# Patient Record
Sex: Male | Born: 1940 | Race: White | Hispanic: No
Health system: Southern US, Community
[De-identification: ages and names within clinical notes are randomized; demographics above are authoritative.]

---

## 2004-04-22 ENCOUNTER — Emergency Department (HOSPITAL_COMMUNITY): Admission: EM | Admit: 2004-04-22 | Discharge: 2004-04-22 | Payer: Self-pay | Admitting: *Deleted

## 2004-04-23 ENCOUNTER — Emergency Department (HOSPITAL_COMMUNITY): Admission: EM | Admit: 2004-04-23 | Discharge: 2004-04-23 | Payer: Self-pay | Admitting: Emergency Medicine

## 2004-04-24 ENCOUNTER — Ambulatory Visit (HOSPITAL_COMMUNITY): Payer: Self-pay | Admitting: General Surgery

## 2004-04-24 ENCOUNTER — Encounter (HOSPITAL_COMMUNITY): Admission: RE | Admit: 2004-04-24 | Discharge: 2004-05-24 | Payer: Self-pay | Admitting: Emergency Medicine

## 2004-04-28 ENCOUNTER — Inpatient Hospital Stay (HOSPITAL_COMMUNITY): Admission: AD | Admit: 2004-04-28 | Discharge: 2004-05-05 | Payer: Self-pay | Admitting: General Surgery

## 2004-08-20 ENCOUNTER — Encounter (HOSPITAL_COMMUNITY): Admission: RE | Admit: 2004-08-20 | Discharge: 2004-09-19 | Payer: Self-pay | Admitting: *Deleted

## 2004-09-24 ENCOUNTER — Encounter (HOSPITAL_COMMUNITY): Admission: RE | Admit: 2004-09-24 | Discharge: 2004-10-24 | Payer: Self-pay | Admitting: Internal Medicine

## 2005-01-22 ENCOUNTER — Encounter (HOSPITAL_COMMUNITY): Admission: RE | Admit: 2005-01-22 | Discharge: 2005-02-21 | Payer: Self-pay | Admitting: *Deleted

## 2005-03-04 ENCOUNTER — Encounter (HOSPITAL_COMMUNITY): Admission: RE | Admit: 2005-03-04 | Discharge: 2005-04-03 | Payer: Self-pay | Admitting: *Deleted
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
## 2007-02-02 ENCOUNTER — Encounter (HOSPITAL_COMMUNITY): Admission: RE | Admit: 2007-02-02 | Discharge: 2007-03-04 | Payer: Self-pay | Admitting: Internal Medicine

## 2007-03-08 ENCOUNTER — Encounter (HOSPITAL_COMMUNITY): Admission: RE | Admit: 2007-03-08 | Discharge: 2007-04-07 | Payer: Self-pay | Admitting: Internal Medicine

## 2007-04-16 ENCOUNTER — Encounter (HOSPITAL_COMMUNITY): Admission: RE | Admit: 2007-04-16 | Discharge: 2007-05-16 | Payer: Self-pay | Admitting: Internal Medicine
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
## 2013-06-30 ENCOUNTER — Ambulatory Visit (HOSPITAL_COMMUNITY)
Admission: RE | Admit: 2013-06-30 | Discharge: 2013-06-30 | Disposition: A | Discharge status: Home / Self Care | Payer: Non-veteran care | Source: Ambulatory Visit | Attending: Internal Medicine | Admitting: Internal Medicine

## 2013-06-30 DIAGNOSIS — M6281 Muscle weakness (generalized): Secondary | ICD-10-CM | POA: Insufficient documentation

## 2013-06-30 DIAGNOSIS — R269 Unspecified abnormalities of gait and mobility: Secondary | ICD-10-CM | POA: Insufficient documentation

## 2013-06-30 DIAGNOSIS — R262 Difficulty in walking, not elsewhere classified: Secondary | ICD-10-CM | POA: Insufficient documentation

## 2013-06-30 DIAGNOSIS — IMO0001 Reserved for inherently not codable concepts without codable children: Secondary | ICD-10-CM | POA: Insufficient documentation

## 2013-06-30 NOTE — Evaluation (Signed)
Physical Therapy Evaluation  Patient Details  Name: [name] D Mocarski MRN: [id number] Date of Birth: 01/05/1941  Today's Date: 06/30/2013 Time: 0855-0930 PT Time Calculation (min): 35 min Charges: Eval 1              Visit#: 1 of 12  Re-eval: 07/30/13 Assessment Diagnosis: Difficulty standing secondary to weakness and poor balance s/p 5th metatarsal fusion following fracture Prior Therapy: no  Authorization: [facility]    Authorization Time Period:    Authorization Visit#: 1 of 12   Past Medical History: No past medical history on file. Past Surgical History: No past surgical history on file.  Subjective Symptoms/Limitations Symptoms: No pain, N/T over the end of the 5th meta tarsal.  Pertinent History: PAtient is s/p fusion of 5th metatarsal following 5th meta tarsal fracture secondary to fall whle ambulating at night >6 months ago. Patient and daughter are poor historians give patient histpry at random. Prior to fracture patient ambulated with SPC. 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 H/o back pain that radiates from neck to posterior hip since 1968 resulting in difficulty sitting. 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  Limitations: Standing How long can you stand comfortably?: Patient is unable to stand >5830minutes secondary to back and hip pain.  Pain Assessment Currently in Pain?: Yes Pain Score: 4  Pain Location: Back Pain Orientation: Mid;Lower Pain Type: Chronic pain Pain Onset: Other (comment) Pain Frequency: Intermittent  Precautions/Restrictions     Balance Screening    Prior Functioning  Prior Function Level of Independence: Independent with basic ADLs  Cognition/Observation Cognition Overall Cognitive Status: Within Functional Limits for tasks assessed Observation/Other Assessments Observations: Forward flexed  posture.   Sensation/Coordination/Flexibility/Functional Tests Sensation Light Touch: Appears Intact Coordination Gross Motor Movements are Fluid and Coordinated: Yes Flexibility Thomas: Positive Functional Tests Functional Tests: single leg stand <4seconds bilateral Functional Tests: Standing reach 8in bilateral   Assessment RLE AROM (degrees) Right Hip Extension: 5 Right Hip Flexion: 90 Right Knee Extension: 0 Right Knee Flexion: 120 Right Ankle Dorsiflexion: 6 Right Ankle Plantar Flexion: 55 Right Ankle Inversion: 15 Right Ankle Eversion: 10 RLE Strength Right Hip ABduction: 4/5 Right Knee Flexion:  (4+/5) Right Knee Extension: 5/5  Exercise/Treatments Mobility/Balance  Ambulation/Gait Ambulation/Gait: Yes Ambulation/Gait Assistance: 5: Supervision Ambulation Distance (Feet): 300 Feet (in 3 minutes) Assistive device: 4-wheeled walker Gait Pattern: Within Functional Limits   Physical Therapy Assessment and Plan PT Assessment and Plan Clinical Impression Statement: Patient displays difficulty walking, need for 4WW with ambulation due to decreased balance secondary to weakness following casting and sedentary life style following R foot 5th metatarsal fracture and subsequent fusion. Patient display decreased foot and ankle mobility , along with decreased strength resulting in difficulty performing single leg tasks and difficulty balancing. Patient will benefit from skilled PT to improve foot and ankle mobility and return patient to prior lovel of function ambulating with SPC with decreased risk of falls and future subsequent injury.      Pt will benefit from skilled therapeutic intervention in order to improve on the following deficits: Abnormal gait;Decreased activity tolerance;Decreased balance;Decreased coordination;Decreased range of motion;Decreased mobility;Impaired flexibility;Decreased strength PT Frequency: Min 2X/week PT Duration: 6 weeks PT  Treatment/Interventions: Gait training;Stair training;Functional mobility training;Therapeutic activities;Therapeutic exercise;Balance training;Patient/family education;Manual techniques PT Plan: Patient was instruted in a walking program this session to increase exercise tolerance. therapy next session to  focus on increasing ankle dorsiflexion, plantar flexion and eversion ROM, Along with increasing foot (DF,PF) and hip (Abd, Ext) strength.     Goals Home Exercise Program Pt/caregiver will Perform Home Exercise Program: For increased strengthening;For increased ROM;For improved balance;Independently PT Goal: Perform Home Exercise Program - Progress: Progressing toward goal PT Short Term Goals Time to Complete Short Term Goals: 3 weeks PT Short Term Goal 1: PAtient's hip abduction strrength will improve to 4/5 from 4-/5 to indicate improved LE strength and stabiliy so patient will be able to tolerate increased time standing.  PT Short Term Goal 2: Patient will be able to reah 10inches forward indicating patient not at high risk for falls PT Short Term Goal 3: Patient will be able to ambulate 356ft with SPC with no loss of balance.  PT Long Term Goals Time to Complete Long Term Goals: Other (comment) (6 weeks) PT Long Term Goal 1: patient will have a BERG balance score >52/56 PT Long Term Goal 2: Patient will be able to single leg stand > 5seconds without loss of balance.  Long Term Goal 3: Patient will be able to ambulate up a flight of stairs with single point cane independently.   Problem List Patient Active Problem List   Diagnosis Date Noted  . Difficulty in walking(719.7) 06/30/2013    PT - End of Session Activity Tolerance: Patient tolerated treatment well General Behavior During Therapy: [facility] for tasks assessed/performed PT Plan of Care PT Home Exercise Plan: Walking program Consulted and Agree with Plan of Care: Patient;Family member/caregiver Family Member Consulted:  daughter  GP Functional Assessment Tool Used: FOTO  Functional Limitation: Mobility: Walking and moving around Mobility: Walking and Moving Around Current Status [phone]): At least 40 percent but less than 60 percent impaired, limited or restricted Mobility: Walking and Moving Around Goal Status [phone]): At least 20 percent but less than 40 percent impaired, limited or restricted  Martita Brumm R PT DPT 06/30/2013, 12:54 PM  Physician Documentation Your signature is required to indicate approval of the treatment plan as stated above.  Please sign and either send electronically or make a copy of this report for your files and return this physician signed original.   Please mark one 1.__approve of plan  2. ___approve of plan with the following conditions.   ______________________________                                                          _____________________ Physician Signature                                                                                                             Date

## 2013-07-05 ENCOUNTER — Ambulatory Visit (HOSPITAL_COMMUNITY): Payer: Non-veteran care | Admitting: Physical Therapy

## 2013-07-07 ENCOUNTER — Ambulatory Visit (HOSPITAL_COMMUNITY)
Admission: RE | Admit: 2013-07-07 | Discharge: 2013-07-07 | Disposition: A | Discharge status: Home / Self Care | Payer: Non-veteran care | Source: Ambulatory Visit | Attending: Internal Medicine | Admitting: Internal Medicine

## 2013-07-07 DIAGNOSIS — IMO0001 Reserved for inherently not codable concepts without codable children: Secondary | ICD-10-CM | POA: Diagnosis present

## 2013-07-07 DIAGNOSIS — M6281 Muscle weakness (generalized): Secondary | ICD-10-CM | POA: Insufficient documentation

## 2013-07-07 DIAGNOSIS — R269 Unspecified abnormalities of gait and mobility: Secondary | ICD-10-CM | POA: Diagnosis not present

## 2013-07-07 DIAGNOSIS — R262 Difficulty in walking, not elsewhere classified: Secondary | ICD-10-CM | POA: Insufficient documentation

## 2013-07-07 NOTE — Progress Notes (Signed)
Physical Therapy Treatment Patient Details  Name: Karlton Lemonerry D Buley MRN: [id number] Date of Birth: 07/11/1940  Today's Date: 07/07/2013 Time: 0840-0932 PT Time Calculation (min): 52 min Charge: TE [id number]  Visit#: 2 of 12  Re-eval: 07/30/13    Authorization: [facility]  Authorization Time Period:    Authorization Visit#: 2 of 12   Subjective: Symptoms/Limitations Symptoms: Pain from occipital region down to toes Pain Assessment Currently in Pain?: Yes Pain Score: 8  Pain Location: Generalized  Objective:   Exercise/Treatments Ankle Stretches Soleus Stretch: 2 reps;30 seconds;Limitations Soleus Stretch Limitations: Bil LE Gastroc Stretch: 2 reps;30 seconds;Limitations Gastroc Stretch Limitations: Bil LE Ankle Exercises - Standing Rocker Board: 2 minutes;Limitations Rocker Board Limitations: R/L Heel Raises: 10 reps Toe Raise: 10 reps Other Standing Ankle Exercises: Gait training with RW with cueing to stance and posture Ankle Exercises - Seated Towel Crunch: 1 rep Towel Inversion/Eversion: 2 reps Marble Pickup: attempted Other Seated Ankle Exercises: Manual tibialis anterior stretches    Manual Therapy Manual Therapy: Joint mobilization Joint Mobilization: Grade II ankle joint mobs  Physical Therapy Assessment and Plan PT Assessment and Plan Clinical Impression Statement: Began POC for ankle ROM, gait training and strengthening.  Pt able to complete exercises following demonstration and verbal cueing for techniuqe with manual facilitation to reduce compensation with hip and knee for increased ROM with ankle.  Stretches added to improve flexibilty.  Gait training with cueing for posture, heel to toe pattern and to increase stride length for more normalized gait mechanics.   No reports of increased pain through session, pt was limited by fatigue. PT Plan: Continue current POC for gait training with LRAD, continue [place] program for LE strenghtening and balance, stretches to  improve ankle ROM.      Goals Home Exercise Program Pt/caregiver will Perform Home Exercise Program: For increased strengthening;For increased ROM;For improved balance;Independently PT Short Term Goals Time to Complete Short Term Goals: 3 weeks PT Short Term Goal 1: PAtient's hip abduction strrength will improve to 4/5 from 4-/5 to indicate improved LE strength and stabiliy so patient will be able to tolerate increased time standing.  PT Short Term Goal 2: Patient will be able to reah 10inches forward indicating patient not at high risk for falls PT Short Term Goal 3: Patient will be able to ambulate 35700ft with SPC with no loss of balance.  PT Short Term Goal 3 - Progress: Progressing toward goal PT Long Term Goals Time to Complete Long Term Goals: Other (comment) (6 weeks) PT Long Term Goal 1: patient will have a BERG balance score >52/56 PT Long Term Goal 2: Patient will be able to single leg stand > 5seconds without loss of balance.  Long Term Goal 3: Patient will be able to ambulate up a flight of stairs with single point cane independently.   Problem List Patient Active Problem List   Diagnosis Date Noted  . Difficulty in walking(719.7) 06/30/2013    PT - End of Session Activity Tolerance: Patient tolerated treatment well General Behavior During Therapy: [facility] for tasks assessed/performed  GP    Juel BurrowCockerham, Rhet Rorke Jo 07/07/2013, 12:13 PM

## 2013-07-12 ENCOUNTER — Ambulatory Visit (HOSPITAL_COMMUNITY)
Admission: RE | Admit: 2013-07-12 | Discharge: 2013-07-12 | Disposition: A | Discharge status: Home / Self Care | Payer: Non-veteran care | Source: Ambulatory Visit | Attending: *Deleted | Admitting: *Deleted

## 2013-07-12 DIAGNOSIS — IMO0001 Reserved for inherently not codable concepts without codable children: Secondary | ICD-10-CM | POA: Diagnosis not present

## 2013-07-12 NOTE — Progress Notes (Signed)
Physical Therapy Treatment Patient Details  Name: [name] MRN: [id number] Date of Birth: 04/26/1940  Today's Date: 07/12/2013 Time: 3086-57840848-0938 PT Time Calculation (min): 50 min Charge: TE [id number]  Visit#: 3 of 12  Re-eval:   Assessment Diagnosis: Difficulty standing secondary to weakness and poor balance s/p 5th metatarsal fusion following fracture Next MD Visit: Charlott HollerEdelman 08/03/2013 Prior Therapy: no  Authorization: [facility]  Authorization Time Period:    Authorization Visit#: 3 of 12   Subjective: Symptoms/Limitations Symptoms: Doing fabulous today, been exercising infront of sink daily.   Pain Assessment Currently in Pain?: No/denies  Objective:   Exercise/Treatments Stretches Gastroc Stretch: 3 reps;30 seconds;Limitations Gastroc Stretch Limitations: slant board Standing Heel Raises: 15 reps;Limitations Heel Raises Limitations: Toe raises 15x Knee Flexion: Both;10 reps Rocker Board: 2 minutes;Limitations Rocker Board Limitations: R/L and A/P Other Standing Knee Exercises: abduction Bil LE 10x Other Standing Knee Exercises: Sidestepping and retro gait 1RT  Ankle Exercises - Seated Towel Crunch: 1 rep Towel Inversion/Eversion: 2 reps    Physical Therapy Assessment and Plan PT Assessment and Plan Clinical Impression Statement: Progressed standing balance and strengthening exercises to improve gait mechanics.  Min assistance requireid with balance activities with cueing to improve spatial awareness with less assistance required.  Pt with improved coordination with towel exercises.  Pt c/o tight calf pain, stretching complete to improve flexibility and assist with pain reduction.   PT Plan: Next session begin gait training with SPC, continye [place] program for LE strengthening and balance, stretches to improve ankle ROM.  Begin seated BAPS board.    Goals Home Exercise Program Pt/caregiver will Perform Home Exercise Program: For increased strengthening;For increased  ROM;For improved balance;Independently PT Short Term Goals Time to Complete Short Term Goals: 3 weeks PT Short Term Goal 1: PAtient's hip abduction strrength will improve to 4/5 from 4-/5 to indicate improved LE strength and stabiliy so patient will be able to tolerate increased time standing.  PT Short Term Goal 1 - Progress: Progressing toward goal PT Short Term Goal 2: Patient will be able to reah 10inches forward indicating patient not at high risk for falls PT Short Term Goal 3: Patient will be able to ambulate 35200ft with SPC with no loss of balance.  PT Short Term Goal 3 - Progress: Progressing toward goal PT Long Term Goals Time to Complete Long Term Goals: Other (comment) (6 weeks) PT Long Term Goal 1: patient will have a BERG balance score >52/56 PT Long Term Goal 1 - Progress: Progressing toward goal PT Long Term Goal 2: Patient will be able to single leg stand > 5seconds without loss of balance.  Long Term Goal 3: Patient will be able to ambulate up a flight of stairs with single point cane independently.   Problem List Patient Active Problem List   Diagnosis Date Noted  . Difficulty in walking(719.7) 06/30/2013    PT - End of Session Equipment Utilized During Treatment: Gait belt Activity Tolerance: Patient tolerated treatment well General Behavior During Therapy: [facility] for tasks assessed/performed  GP    Juel BurrowCockerham, Casey Jo 07/12/2013, 10:00 AM

## 2013-07-14 ENCOUNTER — Ambulatory Visit (HOSPITAL_COMMUNITY)
Admission: RE | Admit: 2013-07-14 | Discharge: 2013-07-14 | Disposition: A | Discharge status: Home / Self Care | Payer: Non-veteran care | Source: Ambulatory Visit | Attending: *Deleted | Admitting: *Deleted

## 2013-07-14 DIAGNOSIS — IMO0001 Reserved for inherently not codable concepts without codable children: Secondary | ICD-10-CM | POA: Diagnosis not present

## 2013-07-14 NOTE — Progress Notes (Signed)
Physical Therapy Treatment Patient Details  Name: [name] MRN: [id number] Date of Birth: 08/13/1940  Today's Date: 07/14/2013 Time: 4098-11910845-0930 PT Time Calculation (min): 45 min  Visit#: 4 of 12  Authorization: [facility]  Authorization Visit#: 4 of 12  Charges:  NMR [phone] (15'), TE [phone] (28')  Subjective: Symptoms/Limitations Symptoms: Pt states he hurts from the base of his skull to his feet bilaterally 8/10.  [facility] and believes thats the reason for his increased pain. Pain Assessment Currently in Pain?: Yes Pain Score: 8  Pain Location: Generalized   Exercise/Treatments Stretches Gastroc Stretch: 3 reps;30 seconds;Limitations Gastroc Stretch Limitations: slant board Standing Heel Raises: 15 reps;Limitations Heel Raises Limitations: Toe raises 15x Knee Flexion: Both;10 reps Rocker Board: 2 minutes;Limitations Rocker Board Limitations: R/L and A/P Other Standing Knee Exercises: abduction Bil LE 10x Other Standing Knee Exercises: Sidestepping and retro gait 1RT Seated Other Seated Knee Exercises: BAPS level 3 10 reps A/P R/L, 5 reps CW/CCW    Physical Therapy Assessment and Plan PT Assessment:  Pt able to complete all exercises with rest break.  Requires therapist facilitation to complete exercises in correct form.  Progressed to BAPS requiring stabilization of knee to isolate correct musculature.   PT Plan: Next session begin gait training with SPC, continue [place] program for LE strengthening and balance, stretches to improve ankle ROM.      Problem List Patient Active Problem List   Diagnosis Date Noted  . Difficulty in walking(719.7) 06/30/2013    PT - End of Session Equipment Utilized During Treatment: Gait belt Activity Tolerance: Patient tolerated treatment well General Behavior During Therapy: [facility] for tasks assessed/performed   Lurena NidaAmy B Frazier, PTA/CLT 07/14/2013, 10:20 AM

## 2013-07-19 ENCOUNTER — Telehealth (HOSPITAL_COMMUNITY): Payer: Self-pay

## 2013-07-19 ENCOUNTER — Ambulatory Visit (HOSPITAL_COMMUNITY)
Admission: RE | Admit: 2013-07-19 | Discharge: 2013-07-19 | Disposition: A | Discharge status: Home / Self Care | Payer: Non-veteran care | Source: Ambulatory Visit | Attending: *Deleted | Admitting: *Deleted

## 2013-07-19 DIAGNOSIS — IMO0001 Reserved for inherently not codable concepts without codable children: Secondary | ICD-10-CM | POA: Diagnosis not present

## 2013-07-19 NOTE — Progress Notes (Signed)
Physical Therapy Treatment Patient Details  Name: [name] MRN: [id number] Date of Birth: 12/03/1940  Today's Date: 07/19/2013 Time: 0850-0931 PT Time Calculation (min): 41 min Neuromuscular Re Ed 850-921, Therapeutic Exercise 922 - 931  Visit#: 5 Re-eval:   Assessment Diagnosis: Difficulty standing secondary to weakness and poor balance s/p 5th metatarsal fusion following fracture Next MD Visit: Charlott HollerEdelman 08/02/2013 Prior Therapy: no  Authorization:    Authorization Time Period:    Authorization Visit#: 5 of 12   Subjective: Symptoms/Limitations Symptoms: [name] states he continues to have pain from the base of his skull doen to his heels bilaterally, 7/10. Patient also noted minor knee pain with standing from chair.  Pain Assessment Currently in Pain?: Yes Pain Score: 7  Pain Location: Generalized  Precautions/Restrictions    Exercise/Treatments Stretches Gastroc Stretch: 3 reps;30 seconds;Limitations Gastroc Stretch Limitations: slant board   Standing Forward Step Up: Step Height: 4";10 reps Functional Squat: 5 reps (limited depth due to limited ankle and knee ROM) Rocker Board: 2 minutes;Limitations Rocker Board Limitations: R/L and A/P Other Standing Knee Exercises: marching with cueing for weight shifting 10x each Walking 14550ft with cues for increasing stride length. Other Standing Knee Exercises: Sidestepping and retro gait 1RT  Physical Therapy Assessment and Plan PT Assessment and Plan Clinical Impression Statement: Progressed standing balance and strengthening exercises to improve gait mechanics. Min assistance required with balance activities with cueing to improve spatial awareness with less assistance required. Patient requires manual cues and Min Assist to weight shift to attain single leg stance on Bilateral LE.  Pt c/o tight calf pain, stretching complete to improve flexibility and assist with pain reduction. Patient demonstrated limited squat ability  due to limited ankle knee and hip mobility, secondary to glut weakness.  Delayed gait trainig with SPC due to patient displaying inappropriate balance and increased utilization of walker for support during gait. .     Pt will benefit from skilled therapeutic intervention in order to improve on the following deficits: Abnormal gait;Decreased activity tolerance;Decreased balance;Decreased coordination;Decreased range of motion;Decreased mobility;Impaired flexibility;Decreased strength PT Frequency: Min 2X/week PT Treatment/Interventions: Gait training;Stair training;Functional mobility training;Therapeutic activities;Therapeutic exercise;Balance training;Patient/family education;Manual techniques    Goals    Problem List Patient Active Problem List   Diagnosis Date Noted  . Difficulty in walking(719.7) 06/30/2013    General Behavior During Therapy: [facility] for tasks assessed/performed  GP    Lindalee Huizinga R Gisell Buehrle PT DPT 07/19/2013, 9:41 AM

## 2013-07-21 ENCOUNTER — Ambulatory Visit (HOSPITAL_COMMUNITY)
Admission: RE | Admit: 2013-07-21 | Discharge: 2013-07-21 | Disposition: A | Discharge status: Home / Self Care | Payer: Non-veteran care | Source: Ambulatory Visit | Attending: *Deleted | Admitting: *Deleted

## 2013-07-21 DIAGNOSIS — IMO0001 Reserved for inherently not codable concepts without codable children: Secondary | ICD-10-CM | POA: Diagnosis not present

## 2013-07-21 NOTE — Progress Notes (Signed)
Physical Therapy Treatment Patient Details  Name: [name] MRN: [id number] Date of Birth: 06/27/1940  Today's Date: 07/21/2013 Time: 0850-0931 PT Time Calculation (min): 41 min Charges: Gait training: 850-900, Therapeutic Exercise 900-931 Visit#: 6 of 12  Re-eval: 07/30/13 Assessment Diagnosis: Difficulty standing secondary to weakness and poor balance s/p 5th metatarsal fusion following fracture Next MD Visit: [name] 08/02/2013 Prior Therapy: no  Authorization: [facility]  Authorization Visit#: 6 of 12   Subjective: Symptoms/Limitations Symptoms: Patient states he was so sore after last session that he slept the day away, though he is feeling better now and ready for more.  Pertinent History: PAtient is s/p fusion of 5th metatarsal following 5th meta tarsal fracture secondary to fall whle ambulating at night >6 months ago. Patient and daughter are poor historians give patient histpry at random. Prior to fracture patient ambulated with SPC. 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 H/o back pain that radiates from neck to posterior hip since 1968 resulting in difficulty sitting. 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  Limitations: Standing How long can you stand comfortably?: Patient is unable to stand >5830minutes secondary to back and hip pain.  Pain Assessment Currently in Pain?: Yes Pain Score: 5  Pain Location: Generalized  Exercise/Treatments Stretches Active Hamstring Stretch: 2 reps;30 seconds Gastroc Stretch: 3 reps;30 seconds;Limitations Gastroc Stretch Limitations: slant board Standing Forward Step Up: 10 reps;Step Height: 6" Functional Squat: 10 reps (mini squat ) Rocker Board: 2 minutes;Limitations Rocker Board Limitations: R/L and A/P Other Standing Knee Exercises: marching with cueing for weight shifting 10x each  Walking 15850ft with cues for increasing stride length. Other Standing Knee Exercises: backwards walking 25 feet with cuign for weight shifting, walking with elongated stride with CGA for balance/safety  Physical Therapy Assessment and Plan PT Assessment and Plan Clinical Impression Statement: Progressed standing balance and strengthening exercises to improve gait mechanics. Patient requires manual cues and Min Assist to weight shift to attain single leg stance on Bilateral LE and during backwards walking. Pt c/o tight calf pain, stretching completed to improve flexibility and assist with pain reduction. Patient demonstrated limited squat ability due to limited ankle knee and hip mobility, secondary to glut weakness. Performed gait training this session with no AD with manual cuing for weight shifting. Following backwards walking patient demonstrated improved walking with no AD and increased stride length. Patient demosntrated difficulty with 6" step ups requiring unilateral and bilateral UE support as patient fatigued. This session focuse more on gait training and less on strenmgthening to improve patient recovery following increased soreness after last session. Pt will benefit from skilled therapeutic intervention in order to improve on the following deficits: Abnormal gait;Decreased activity tolerance;Decreased balance;Decreased coordination;Decreased range of motion;Decreased mobility;Impaired flexibility;Decreased strength Rehab Potential: Good PT Plan: Next session begin gait training with SPC, continye [place] program for LE strengthening and balance, stretches to improve ankle ROM.  attempt short stride lunges and lunging to 4" box next session.     Goals PT Short Term Goals PT Short Term Goal 1 - Progress: Progressing toward goal PT Short Term Goal 2 - Progress: Progressing toward goal PT Short Term Goal 3 - Progress: Progressing toward goal PT Long Term Goals PT Long Term Goal 1 - Progress:  Progressing toward goal PT Long Term Goal 2 - Progress: Progressing toward goal Long Term Goal 3 Progress: Progressing toward  goal  Problem List Patient Active Problem List   Diagnosis Date Noted  . Difficulty in walking(719.7) 06/30/2013    PT - End of Session Equipment Utilized During Treatment: Gait belt Activity Tolerance: Patient tolerated treatment well General Behavior During Therapy: [name] for tasks assessed/performed PT Plan of Care PT Home Exercise Plan: walking program, claf stretch, and backwards walking with CGA Consulted and Agree with Plan of Care: Patient;Family member/caregiver Family Member Consulted: daughter  GP    Doyne KeelCash R Tashina Credit PT DPT 07/21/2013, 12:14 PM

## 2013-07-26 ENCOUNTER — Ambulatory Visit (HOSPITAL_COMMUNITY): Payer: Non-veteran care | Admitting: Physical Therapy

## 2013-07-28 ENCOUNTER — Ambulatory Visit (HOSPITAL_COMMUNITY)
Admission: RE | Admit: 2013-07-28 | Discharge: 2013-07-28 | Disposition: A | Discharge status: Home / Self Care | Payer: Non-veteran care | Source: Ambulatory Visit | Attending: *Deleted | Admitting: *Deleted

## 2013-07-28 DIAGNOSIS — IMO0001 Reserved for inherently not codable concepts without codable children: Secondary | ICD-10-CM | POA: Diagnosis not present

## 2013-07-28 NOTE — Progress Notes (Signed)
Physical Therapy Treatment Patient Details  Name: [name] MRN: [id number] Date of Birth: 04/16/1940  Today's Date: 07/28/2013 Time: [id number] PT Time Calculation (min): 50 min Charge: Gait 0845-0900, NMR [phone], TE [id number]  Visit#: 7 of 12  Re-eval: 07/30/13 Assessment Diagnosis: Difficulty standing secondary to weakness and poor balance s/p 5th metatarsal fusion following fracture Next MD Visit: Charlott HollerEdelman 08/02/2013 Prior Therapy: no  [facility]  Authorization Time Period:    Authorization Visit#: 7 of 12   Subjective: Symptoms/Limitations Symptoms: Pt stated he was 10/10 from forehead down, has injured hand from dog leash.   Pain Assessment Currently in Pain?: Yes Pain Score: 10-Worst pain ever Pain Location: Generalized  Objective:   Exercise/Treatments Stretches Gastroc Stretch: 3 reps;30 seconds;Limitations Gastroc Stretch Limitations: slant board Aerobic Stationary Bike: Nustep x 6 min hill level 3, resistance 3 SPM goal >75 Standing Forward Lunges: Both;10 reps;Limitations Forward Lunges Limitations: on 4 in step Rocker Board: 2 minutes;Limitations Rocker Board Limitations: R/L and A/P Gait Training: Gait training with 2 therapists with Bil UE on SPC for arm sequencing Other Standing Knee Exercises: backwards walking 25 feet with cuign for weight shifting, walking with elongated stride with CGA for balance/safety   Physical Therapy Assessment and Plan PT Assessment and Plan Clinical Impression Statement: Therapist facilitation with gait training utiliizing SPC Bil UE to improve arm swing with opposite heel strike with visual/verbal cueing to improve step length and stance phase Rt LE.  Pt with cueing for posture and to increase stride length.  Added forward lunges on 4in step for LE stregthening and began NuStep for strengthening, activity tolerance and to improve sequencing with UE and LE.  Pt limited by fatigue at end of session.   PT Plan:  Continue gait training with SPC, continue [place] program for LE strengthening and balance, stretches to improve ankle ROM. Continue lunging for LE strengthening and Nustep for activity tolerance.    Goals Home Exercise Program Pt/caregiver will Perform Home Exercise Program: For increased strengthening;For increased ROM;For improved balance;Independently PT Short Term Goals Time to Complete Short Term Goals: 3 weeks PT Short Term Goal 1: PAtient's hip abduction strrength will improve to 4/5 from 4-/5 to indicate improved LE strength and stabiliy so patient will be able to tolerate increased time standing.  PT Short Term Goal 1 - Progress: Progressing toward goal PT Short Term Goal 2: Patient will be able to reah 10inches forward indicating patient not at high risk for falls PT Short Term Goal 3: Patient will be able to ambulate 37700ft with SPC with no loss of balance.  PT Short Term Goal 3 - Progress: Progressing toward goal PT Long Term Goals Time to Complete Long Term Goals: Other (comment) (6 weeks) PT Long Term Goal 1: patient will have a BERG balance score >52/56 PT Long Term Goal 1 - Progress: Progressing toward goal PT Long Term Goal 2: Patient will be able to single leg stand > 5seconds without loss of balance.  Long Term Goal 3: Patient will be able to ambulate up a flight of stairs with single point cane independently.   Problem List Patient Active Problem List   Diagnosis Date Noted  . Difficulty in walking(719.7) 06/30/2013    PT - End of Session Equipment Utilized During Treatment: Gait belt Activity Tolerance: Patient tolerated treatment well General Behavior During Therapy: [facility] for tasks assessed/performed  GP    [name] 07/28/2013, 12:32 PM

## 2013-08-01 ENCOUNTER — Ambulatory Visit (HOSPITAL_COMMUNITY)
Admission: RE | Admit: 2013-08-01 | Discharge: 2013-08-01 | Disposition: A | Discharge status: Home / Self Care | Payer: Non-veteran care | Source: Ambulatory Visit | Attending: *Deleted | Admitting: *Deleted

## 2013-08-01 DIAGNOSIS — IMO0001 Reserved for inherently not codable concepts without codable children: Secondary | ICD-10-CM | POA: Diagnosis not present

## 2013-08-01 NOTE — Progress Notes (Signed)
Physical Therapy Re-evaluation/Treatment  Patient Details  Name: [name] MRN: [id number] Date of Birth: [birth year]  Today's Date: 08/01/2013 Time: 5643-3295 PT Time Calculation (min): 50 min Charge: Gait [phone], Physical Performance testing [phone], MMT [phone]              Visit#: 8 of 12  Re-eval: 07/30/13 Assessment Diagnosis: Difficulty standing secondary to weakness and poor balance s/p 5th metatarsal fusion following fracture Next MD Visit: Mart Piggs 08/02/2013 Prior Therapy: no  Authorization: [facility]    Authorization Time Period:    Authorization Visit#: 8 of 12   Subjective Symptoms/Limitations Symptoms: Pt stated he was tired following session last week.  Pt entered dept ambulating with SPC. Pain Assessment Currently in Pain?: Yes Pain Score: 10-Worst pain ever Pain Location: Generalized   Assessment RLE Strength Right Hip ABduction:  (4+/5 was 4/5)  Exercise/Treatments Mobility/Balance  Berg Balance Test Sit to Stand: Able to stand  independently using hands Standing Unsupported: Able to stand 2 minutes with supervision Sitting with Back Unsupported but Feet Supported on Floor or Stool: Able to sit safely and securely 2 minutes Stand to Sit: Controls descent by using hands Transfers: Able to transfer safely, definite need of hands Standing Unsupported with Eyes Closed: Able to stand 10 seconds with supervision Standing Ubsupported with Feet Together: Able to place feet together independently and stand for 1 minute with supervision From Standing, Reach Forward with Outstretched Arm: Can reach forward >5 cm safely (2") From Standing Position, Pick up Object from Floor: Able to pick up shoe, needs supervision From Standing Position, Turn to Look Behind Over each Shoulder: Needs supervision when turning Turn 360 Degrees: Able to turn 360 degrees safely but slowly Standing Unsupported, Alternately Place Feet on Step/Stool: Able to complete >2 steps/needs  minimal assist Standing Unsupported, One Foot in Front: Loses balance while stepping or standing Standing on One Leg: Able to lift leg independently and hold equal to or more than 3 seconds Total Score: 33   Standing Gait Training: Gait training 240 feet x 16' with multimodal cueing     Physical Therapy Assessment and Plan PT Assessment and Plan Clinical Impression Statement: Re-eval complete with the following findings:  Pt stated compliance with HEP.  Pt has improved LE strength.  Gait training complete with SPC today, pt requires multimodal cueing for appropriate sequencing,posture, heel to toe pattern gait and equalized stride length. Began BERG balance test with score 33/56, pt encouraged to continue ambulating with rollator to reduce risks of falls.  Pt reported he has falling 2 weeks ago.  Recommend continuing OPPT for 4 more weeks to improve balance and gait mechanics to reduce risk of injury. PT Plan: Recommend continuing OPPT for 4 more weeks to improve balance and gait mechanics.  Continue gait training with SPC, continue [place] program for LE strengthening and balance, stretches to improve ankle ROM. Continue lunging for LE strengthening and Nustep for activity tolerance.Marland Kitchen  FOTO and Gcode due next session due to limited by time this session.      Goals Home Exercise Program Pt/caregiver will Perform Home Exercise Program: For increased strengthening;For increased ROM;For improved balance;Independently PT Short Term Goals Time to Complete Short Term Goals: 3 weeks PT Short Term Goal 1: PAtient's hip abduction strrength will improve to 4/5 from 4-/5 to indicate improved LE strength and stabiliy so patient will be able to tolerate increased time standing.  PT Short Term Goal 1 - Progress: Met PT Short Term Goal 2: Patient will be able  to reah 10inches forward indicating patient not at high risk for falls PT Short Term Goal 2 - Progress: Progressing toward goal PT Short Term Goal 3:  Patient will be able to ambulate 363ft with SPC with no loss of balance.  PT Short Term Goal 3 - Progress: Progressing toward goal PT Long Term Goals Time to Complete Long Term Goals: Other (comment) (6 weeks) PT Long Term Goal 1: patient will have a BERG balance score >52/56 PT Long Term Goal 1 - Progress: Progressing toward goal PT Long Term Goal 2: Patient will be able to single leg stand > 5seconds without loss of balance.  PT Long Term Goal 2 - Progress: Progressing toward goal Long Term Goal 3: Patient will be able to ambulate up a flight of stairs with single point cane independently.   Problem List Patient Active Problem List   Diagnosis Date Noted  . Difficulty in walking(719.7) 06/30/2013    PT - End of Session Equipment Utilized During Treatment: Gait belt Activity Tolerance: Patient tolerated treatment well General Behavior During Therapy: [facility] for tasks assessed/performed  GP   Devona Konig PT DPT  Aldona Lento 08/01/2013, 12:25 PM  Physician Documentation Your signature is required to indicate approval of the treatment plan as stated above.  Please sign and either send electronically or make a copy of this report for your files and return this physician signed original.   Please mark one 1.__approve of plan  2. ___approve of plan with the following conditions.   ______________________________                                                          _____________________ Physician Signature                                                                                                             Date

## 2013-08-02 ENCOUNTER — Ambulatory Visit (HOSPITAL_COMMUNITY): Payer: Non-veteran care

## 2013-08-04 ENCOUNTER — Ambulatory Visit (HOSPITAL_COMMUNITY)
Admission: RE | Admit: 2013-08-04 | Discharge: 2013-08-04 | Disposition: A | Discharge status: Home / Self Care | Payer: Non-veteran care | Source: Ambulatory Visit | Attending: *Deleted | Admitting: *Deleted

## 2013-08-04 DIAGNOSIS — IMO0001 Reserved for inherently not codable concepts without codable children: Secondary | ICD-10-CM | POA: Diagnosis not present

## 2013-08-04 NOTE — Progress Notes (Signed)
Physical Therapy Treatment Patient Details  Name: [name] MRN: [id number] Date of Birth: 09/30/1940  Today's Date: 08/04/2013 Time: [id number] PT Time Calculation (min): 48 min Charge:  TE [phone], NMR [phone], Gait [phone]  Visit#: 9 of 12  Re-eval: Jan 12, 2014    Authorization: [facility]  Authorization Time Period:    Authorization Visit#: 9 of 12   Subjective: Symptoms/Limitations Symptoms: Pt brought caretaler into session today to examine therapy.  Pt stated he has been trying to walk with [facility] working on sequence at home/ Pain Assessment Currently in Pain?: Yes Pain Score: 10-Worst pain ever Pain Location: Generalized  Objective:   Exercise/Treatments Standing Rocker Board: 3 minutes;Limitations Rocker Board Limitations: R/L and A/P Gait Training: Gait training with SPC with min assistnace and cueing for sequencing Other Standing Knee Exercises: 3D hip excursion  Balance Exercises Standing Standing Eyes Opened: Narrow base of support (BOS);2 reps;Time Standing Eyes Opened Time: 2x 60" working on standing no AD focusing on Retro Gait: 1 rep Sidestepping: 1 rep Turning: 5 reps   Physical Therapy Assessment and Plan PT Assessment and Plan Clinical Impression Statement: Re-eval completd last session, G-code updated this session with physical therapist direct care (Kyron Schlitt DPT) due to lack of time last session for patient to complete selected outcomes measures. Therapy focus on improving static standing balance with cueing to improve spatial awareness and dynamic activities to improve gait mechanics.  Mod assistance required with siidestepping and retro gait.  Improved sequencing noted today wuth SPC, less assistance required and pt able to verbalize appropriate sequencing. PT Plan: Continue balance activities, functional strenghtening and gait training with SPC, lunges and Nustep for activity tolerance.    Goals Home Exercise Program Pt/caregiver will Perform  Home Exercise Program: For increased strengthening;For increased ROM;For improved balance;Independently PT Short Term Goals Time to Complete Short Term Goals: 3 weeks PT Short Term Goal 1: PAtient's hip abduction strrength will improve to 4/5 from 4-/5 to indicate improved LE strength and stabiliy so patient will be able to tolerate increased time standing.  PT Short Term Goal 2: Patient will be able to reah 10inches forward indicating patient not at high risk for falls PT Short Term Goal 3: Patient will be able to ambulate 32500ft with SPC with no loss of balance.  PT Short Term Goal 3 - Progress: Progressing toward goal PT Long Term Goals Time to Complete Long Term Goals: Other (comment) (6 weeks) PT Long Term Goal 1: patient will have a BERG balance score >52/56 PT Long Term Goal 1 - Progress: Progressing toward goal PT Long Term Goal 2: Patient will be able to single leg stand > 5seconds without loss of balance.  Long Term Goal 3: Patient will be able to ambulate up a flight of stairs with single point cane independently.   Problem List Patient Active Problem List   Diagnosis Date Noted  . Difficulty in walking(719.7) 06/30/2013    PT - End of Session Equipment Utilized During Treatment: Gait belt Activity Tolerance: Patient tolerated treatment well General Behavior During Therapy: [facility] for tasks assessed/performed  GP Functional Assessment Tool Used: FOTO was 58% limitation, currently is 68% limiitation, despite patient stating he feels he is moving around better and improving in his movements at home.  Mobility: Walking and Moving Around Current Status [phone](G8978): At least 60 percent but less than 80 percent impaired, limited or restricted Mobility: Walking and Moving Around Goal Status [phone](G8979): At least 20 percent but less than 40 percent impaired, limited or restricted  Juel BurrowCasey Jo Cockerham 08/04/2013, 11:56 AM  Jerilee Fieldash Keaunna Skipper PT DPT

## 2013-08-04 NOTE — Progress Notes (Signed)
Physical Therapy Treatment Patient Details  Name: [name] MRN: [id number] Date of Birth: 09/30/1940  Today's Date: 08/04/2013 Time: [id number] PT Time Calculation (min): 48 min Charge:  TE [id number], NMR [id number], Gait [id number]  Visit#: 9 of 12  Re-eval: Jan 12, 2014    Authorization: [facility]  Authorization Time Period:    Authorization Visit#: 9 of 12   Subjective: Symptoms/Limitations Symptoms: Pt brought caretaler into session today to examine therapy.  Pt stated he has been [facility] working [facility] sequence at [facility]/ Pain Assessment Currently in Pain?: Yes Pain Score: 10-Worst pain ever Pain Location: Generalized  Objective:   Exercise/Treatments Standing Rocker Board: 3 minutes;Limitations Rocker Board Limitations: R/L and A/P Gait Training: Gait training with SPC with min assistnace and cueing for sequencing Other Standing Knee Exercises: 3D hip excursion  Balance Exercises Standing Standing Eyes Opened: Narrow base of support (BOS);2 reps;Time Standing Eyes Opened Time: 2x 60" working on standing no AD focusing on Retro Gait: 1 rep Sidestepping: 1 rep Turning: 5 reps   Physical Therapy Assessment and Plan PT Assessment and Plan Clinical Impression Statement: Re-eval completd last session, G-code updated this session with physical therapist direct care (Cash DeWitt DPT) due to lack of time last session for patient to complete selected outcomes measures. Therapy focus on improving static standing balance with cueing to improve spatial awareness and dynamic activities to improve gait mechanics.  Mod assistance required with siidestepping and retro gait.  Improved sequencing noted today wuth SPC, less assistance required and pt able to verbalize appropriate sequencing. PT Plan: Continue balance activities, functional strenghtening and gait training with SPC, lunges and Nustep for activity tolerance.    Goals Home Exercise Program Pt/caregiver will Perform  Home Exercise Program: For increased strengthening;For increased ROM;For improved balance;Independently PT Short Term Goals Time to Complete Short Term Goals: 3 weeks PT Short Term Goal 1: PAtient's hip abduction strrength will improve to 4/5 from 4-/5 to indicate improved LE strength and stabiliy so patient will be able to tolerate increased time standing.  PT Short Term Goal 2: Patient will be able to reah 10inches forward indicating patient not at high risk for falls PT Short Term Goal 3: Patient will be able to ambulate 32500ft with SPC with no loss of balance.  PT Short Term Goal 3 - Progress: Progressing toward goal PT Long Term Goals Time to Complete Long Term Goals: Other (comment) (6 weeks) PT Long Term Goal 1: patient will have a BERG balance score >52/56 PT Long Term Goal 1 - Progress: Progressing toward goal PT Long Term Goal 2: Patient will be able to single leg stand > 5seconds without loss of balance.  Long Term Goal 3: Patient will be able to ambulate up a flight of stairs with single point cane independently.   Problem List Patient Active Problem List   Diagnosis Date Noted  . Difficulty in walking(719.7) 06/30/2013    PT - End of Session Equipment Utilized During Treatment: Gait belt Activity Tolerance: Patient tolerated treatment well General Behavior During Therapy: [facility] tasks assessed/performed  GP Functional Assessment Tool Used: FOTO was 58% limitation, currently is 68% limiitation, despite patient stating he feels he is moving around better and improving in his movements at home.  Mobility: Walking and Moving Around Current Status [phone](G8978): At least 60 percent but less than 80 percent impaired, limited or restricted Mobility: Walking and Moving Around Goal Status [phone](G8979): At least 20 percent but less than 40 percent impaired, limited or restricted  Juel BurrowCasey Jo Adith Tejada 08/04/2013, 11:56 AM

## 2013-08-08 ENCOUNTER — Ambulatory Visit (HOSPITAL_COMMUNITY): Payer: Non-veteran care | Admitting: Physical Therapy

## 2013-08-08 ENCOUNTER — Telehealth (HOSPITAL_COMMUNITY): Payer: Self-pay

## 2013-08-09 ENCOUNTER — Ambulatory Visit (HOSPITAL_COMMUNITY): Payer: Non-veteran care | Admitting: *Deleted

## 2013-08-11 ENCOUNTER — Ambulatory Visit (HOSPITAL_COMMUNITY): Payer: Non-veteran care | Admitting: *Deleted

## 2013-08-11 ENCOUNTER — Telehealth (HOSPITAL_COMMUNITY): Payer: Self-pay

## 2013-08-23 ENCOUNTER — Ambulatory Visit (HOSPITAL_COMMUNITY): Payer: Non-veteran care | Admitting: Physical Therapy

## 2013-08-25 ENCOUNTER — Ambulatory Visit (HOSPITAL_COMMUNITY): Payer: Non-veteran care | Admitting: Physical Therapy

## 2013-08-31 ENCOUNTER — Ambulatory Visit (HOSPITAL_COMMUNITY): Payer: Non-veteran care

## 2013-09-02 ENCOUNTER — Ambulatory Visit (HOSPITAL_COMMUNITY): Payer: Non-veteran care | Admitting: Physical Therapy

## 2013-09-05 ENCOUNTER — Inpatient Hospital Stay (HOSPITAL_COMMUNITY): Admission: RE | Admit: 2013-09-05 | Payer: Non-veteran care | Source: Ambulatory Visit | Admitting: Physical Therapy

## 2013-09-05 ENCOUNTER — Telehealth (HOSPITAL_COMMUNITY): Payer: Self-pay | Admitting: Physical Therapy

## 2013-09-05 DEATH — deceased

## 2013-09-07 ENCOUNTER — Ambulatory Visit (HOSPITAL_COMMUNITY): Payer: Non-veteran care

## 2013-09-09 ENCOUNTER — Ambulatory Visit (HOSPITAL_COMMUNITY): Payer: Non-veteran care | Admitting: Physical Therapy

## 2013-09-12 ENCOUNTER — Ambulatory Visit (HOSPITAL_COMMUNITY): Payer: Non-veteran care

## 2013-09-14 ENCOUNTER — Ambulatory Visit (HOSPITAL_COMMUNITY): Payer: Non-veteran care

## 2013-09-16 ENCOUNTER — Ambulatory Visit (HOSPITAL_COMMUNITY): Payer: Non-veteran care | Admitting: Physical Therapy

## 2013-11-22 NOTE — Addendum Note (Signed)
Encounter addended by: Doyne Keelash R Aliahna Statzer, PT on: 11/22/2013  9:48 AM<BR>     Documentation filed: Notes Section

## 2013-11-22 NOTE — Progress Notes (Signed)
DISCHARGE Patient Details  Name: Karlton Lemonerry D Renda MRN: [id number] Date of Birth: 04/15/1940  Today's Date: 11/22/2013  Patient discharged following not returning to therapy. Last visit  07/07/13  Jerilee FieldeWitt, Brieann Osinski R 11/22/2013, 9:46 AM
